# Patient Record
Sex: Male | Born: 1935 | Race: White | Hispanic: No | Marital: Married | Smoking: Former smoker
Health system: Southern US, Community
[De-identification: ages and names within clinical notes are randomized; demographics above are authoritative.]

## PROBLEM LIST (undated history)

## (undated) DIAGNOSIS — Z9889 Other specified postprocedural states: Secondary | ICD-10-CM

## (undated) DIAGNOSIS — I10 Essential (primary) hypertension: Secondary | ICD-10-CM

## (undated) DIAGNOSIS — Z8719 Personal history of other diseases of the digestive system: Secondary | ICD-10-CM

## (undated) DIAGNOSIS — E7800 Pure hypercholesterolemia, unspecified: Secondary | ICD-10-CM

## (undated) DIAGNOSIS — R112 Nausea with vomiting, unspecified: Secondary | ICD-10-CM

## (undated) DIAGNOSIS — C801 Malignant (primary) neoplasm, unspecified: Secondary | ICD-10-CM

## (undated) DIAGNOSIS — M1990 Unspecified osteoarthritis, unspecified site: Secondary | ICD-10-CM

## (undated) DIAGNOSIS — R351 Nocturia: Secondary | ICD-10-CM

---

## 1991-04-07 HISTORY — PX: HERNIA REPAIR: SHX51

## 1998-07-06 HISTORY — PX: APPENDECTOMY: SHX54

## 1999-10-06 HISTORY — PX: CAROTID ENDARTERECTOMY: SUR193

## 2000-05-07 HISTORY — PX: KNEE ARTHROSCOPY: SUR90

## 2002-05-07 HISTORY — PX: CERVICAL FUSION: SHX112

## 2003-06-16 ENCOUNTER — Ambulatory Visit (HOSPITAL_COMMUNITY): Admission: RE | Admit: 2003-06-16 | Discharge: 2003-06-16 | Payer: Self-pay | Admitting: Neurosurgery

## 2004-03-07 HISTORY — PX: BACK SURGERY: SHX140

## 2004-04-05 ENCOUNTER — Inpatient Hospital Stay (HOSPITAL_COMMUNITY): Admission: RE | Admit: 2004-04-05 | Discharge: 2004-04-07 | Payer: Self-pay | Admitting: Neurosurgery

## 2004-07-19 ENCOUNTER — Ambulatory Visit (HOSPITAL_COMMUNITY): Admission: RE | Admit: 2004-07-19 | Discharge: 2004-07-19 | Payer: Self-pay | Admitting: Neurosurgery

## 2005-04-16 ENCOUNTER — Inpatient Hospital Stay (HOSPITAL_COMMUNITY): Admission: RE | Admit: 2005-04-16 | Discharge: 2005-04-17 | Payer: Self-pay | Admitting: Neurosurgery

## 2007-12-06 HISTORY — PX: CHOLECYSTECTOMY: SHX55

## 2010-05-07 DIAGNOSIS — C801 Malignant (primary) neoplasm, unspecified: Secondary | ICD-10-CM

## 2010-05-07 HISTORY — DX: Malignant (primary) neoplasm, unspecified: C80.1

## 2011-01-18 ENCOUNTER — Other Ambulatory Visit (HOSPITAL_COMMUNITY): Payer: Self-pay | Admitting: *Deleted

## 2011-01-18 DIAGNOSIS — C61 Malignant neoplasm of prostate: Secondary | ICD-10-CM

## 2011-01-26 ENCOUNTER — Ambulatory Visit (HOSPITAL_COMMUNITY)
Admission: RE | Admit: 2011-01-26 | Discharge: 2011-01-26 | Disposition: A | Discharge status: Home / Self Care | Payer: Medicare Other | Source: Ambulatory Visit | Attending: Radiology | Admitting: Radiology

## 2011-01-26 DIAGNOSIS — C61 Malignant neoplasm of prostate: Secondary | ICD-10-CM | POA: Insufficient documentation

## 2011-01-26 MED ORDER — GADOBENATE DIMEGLUMINE 529 MG/ML IV SOLN
17.0000 mL | Freq: Once | INTRAVENOUS | Status: AC | PRN
  Administered 2011-01-26: 17 mL via INTRAVENOUS

## 2011-03-09 MED ORDER — GADOBENATE DIMEGLUMINE 529 MG/ML IV SOLN
20.0000 mL | Freq: Once | INTRAVENOUS | Status: AC | PRN
  Administered 2011-03-09: 17 mL via INTRAVENOUS

## 2012-04-06 HISTORY — PX: CARPAL TUNNEL RELEASE: SHX101

## 2013-03-11 ENCOUNTER — Other Ambulatory Visit: Payer: Self-pay | Admitting: Neurosurgery

## 2013-03-25 ENCOUNTER — Encounter (HOSPITAL_COMMUNITY): Payer: Self-pay | Admitting: Pharmacy Technician

## 2013-03-30 NOTE — Pre-Procedure Instructions (Signed)
Raymond Holloway  03/30/2013   Your procedure is scheduled on: Wednesday, April 08, 2013 at 8:30 AM   Report to [facility] Entrance "A" [address] at [employer] AM.  Call this number if you have problems the morning of surgery: [phone]   Remember:   Do not eat food or drink liquids after midnight.   Take these medicines the morning of surgery with A SIP OF WATER: Hydrocodone-acetaminophen (Vicodin), methylphenidate (Ritalin), metoprolol (Lopressor)  STOP taking Aspirin, Goody's, BC's, Aleve (Naproxen), Ibuprofen (Advil or Motrin), Fish Oil, Vitamins, Herbal Supplements or any substance that could thin your blood starting Wednesday 03/31/2013.   Do not wear jewelry.  Do not wear lotions, powders, or colognes. You may wear deodorant.   Men may shave face and neck.  Do not bring valuables to the hospital.  [facility] is not responsible                  for any belongings or valuables.               Contacts, dentures or bridgework may not be worn into surgery.  Leave suitcase in the car. After surgery it may be brought to your room.  For patients admitted to the hospital, discharge time is determined by your                treatment team.               Special Instructions: Shower using CHG 2 nights before surgery and the night before surgery.  If you shower the day of surgery use CHG.  Use special wash - you have one bottle of CHG for all showers.  You should use approximately 1/3 of the bottle for each shower.   Please read over the following fact sheets that you were given: Pain Booklet, Coughing and Deep Breathing, Blood Transfusion Information, MRSA Information and Surgical Site Infection Prevention

## 2013-03-31 ENCOUNTER — Encounter (HOSPITAL_COMMUNITY)
Admission: RE | Admit: 2013-03-31 | Discharge: 2013-03-31 | Disposition: A | Discharge status: Home / Self Care | Payer: Medicare Other | Source: Ambulatory Visit | Attending: Anesthesiology | Admitting: Anesthesiology

## 2013-03-31 ENCOUNTER — Encounter (HOSPITAL_COMMUNITY): Payer: Self-pay

## 2013-03-31 ENCOUNTER — Encounter (HOSPITAL_COMMUNITY)
Admission: RE | Admit: 2013-03-31 | Discharge: 2013-03-31 | Disposition: A | Discharge status: Home / Self Care | Payer: Medicare Other | Source: Ambulatory Visit | Attending: Neurosurgery | Admitting: Neurosurgery

## 2013-03-31 ENCOUNTER — Ambulatory Visit (HOSPITAL_COMMUNITY): Admission: RE | Admit: 2013-03-31 | Payer: Medicare Other | Source: Ambulatory Visit

## 2013-03-31 DIAGNOSIS — Z01812 Encounter for preprocedural laboratory examination: Secondary | ICD-10-CM | POA: Insufficient documentation

## 2013-03-31 DIAGNOSIS — Z01818 Encounter for other preprocedural examination: Secondary | ICD-10-CM | POA: Insufficient documentation

## 2013-03-31 HISTORY — DX: Other specified postprocedural states: Z98.890

## 2013-03-31 HISTORY — DX: Malignant (primary) neoplasm, unspecified: C80.1

## 2013-03-31 HISTORY — DX: Unspecified osteoarthritis, unspecified site: M19.90

## 2013-03-31 HISTORY — DX: Essential (primary) hypertension: I10

## 2013-03-31 HISTORY — DX: Nocturia: R35.1

## 2013-03-31 HISTORY — DX: Nausea with vomiting, unspecified: R11.2

## 2013-03-31 HISTORY — DX: Pure hypercholesterolemia, unspecified: E78.00

## 2013-03-31 HISTORY — DX: Personal history of other diseases of the digestive system: Z87.19

## 2013-03-31 LAB — CBC
HCT: 31.9 % — ABNORMAL LOW (ref 39.0–52.0)
Hemoglobin: 11 g/dL — ABNORMAL LOW (ref 13.0–17.0)
MCH: 32.4 pg (ref 26.0–34.0)
MCHC: 34.5 g/dL (ref 30.0–36.0)
MCV: 93.8 fL (ref 78.0–100.0)
RBC: 3.4 MIL/uL — ABNORMAL LOW (ref 4.22–5.81)

## 2013-03-31 LAB — BASIC METABOLIC PANEL
BUN: 21 mg/dL (ref 6–23)
CO2: 27 mEq/L (ref 19–32)
Chloride: 104 mEq/L (ref 96–112)
GFR calc Af Amer: 74 mL/min — ABNORMAL LOW (ref >90)
GFR calc non Af Amer: 63 mL/min — ABNORMAL LOW (ref >90)
Glucose, Bld: 95 mg/dL (ref 70–99)
Potassium: 4.4 mEq/L (ref 3.5–5.1)
Sodium: 140 mEq/L (ref 135–145)

## 2013-03-31 LAB — TYPE AND SCREEN: Antibody Screen: NEGATIVE

## 2013-03-31 LAB — SURGICAL PCR SCREEN
MRSA, PCR: NEGATIVE
Staphylococcus aureus: NEGATIVE

## 2013-03-31 LAB — ABO/RH: ABO/RH(D): B POS

## 2013-03-31 NOTE — Progress Notes (Addendum)
Pt denies seeing a Cardiologist on a regular basis but reports last EKG was done 04-20-12 at [facility] in [facility], records requested.  Pt denies ever having heart cath, stress test or ECHO done.   Pt denies having CXR done over the last year.  PCP Dr. Lendon Colonel with [facility].  Urologist is Dr. Sabino Gasser in [facility].

## 2013-04-01 ENCOUNTER — Encounter (HOSPITAL_COMMUNITY): Payer: Self-pay

## 2013-04-01 NOTE — Progress Notes (Signed)
Anesthesia chart review: Patient is a 79 year old male scheduled for L3-4, L4-5 PLIF on 04/08/2013 by Dr. Franky Macho.   History includes former smoker, hypertension, hypercholesterolemia, prostate cancer status post chemoradiation, hiatal hernia, arthritis, postoperative nausea and vomiting, cervical fusion '04, cholecystectomy '09, appendectomy '00, back surgery '05, left CTR 04/2012, left carotid endarterectomy '01, hernia repair '92.  PCP is Dr. Lendon Colonel with [facility]. Urologist is Dr. Sabino Gasser in [facility]  He is not followed by cardiology and denies prior cardiac testing.  Pre-operative EKG from 04/21/12 from [facility] showed SB @ 59 bpm, poor r wave progression--consider old anterior infarct, LAD.  He has lost his r wave in lead II (was minimal back in 2006), but otherwise probably not significantly changed since 04/21/12. No CV symptoms documented from his PAT visit.  Preoperative CXR and labs noted.    Reviewed above with anesthesiologist Dr. Noreene Larsson.  Patient will be further evaluated by his assigned anesthesiologist on the day of surgery, but if he remains asymptomatic from a CV standpoint then it is anticipated that he can proceed as planned.  Velna Ochs [facility] Short Stay Center/Anesthesiology Phone [phone] 04/01/2013 11:43 AM

## 2013-04-07 MED ORDER — CEFAZOLIN SODIUM-DEXTROSE 2-3 GM-% IV SOLR
2.0000 g | INTRAVENOUS | Status: AC
  Administered 2013-04-08 (×2): 2 g via INTRAVENOUS
  Filled 2013-04-07: qty 50

## 2013-04-08 ENCOUNTER — Encounter (HOSPITAL_COMMUNITY): Payer: Self-pay | Admitting: Anesthesiology

## 2013-04-08 ENCOUNTER — Encounter (HOSPITAL_COMMUNITY)
Admission: RE | Disposition: A | Discharge status: Home Health | Payer: Medicare Other | Source: Ambulatory Visit | Attending: Neurosurgery

## 2013-04-08 ENCOUNTER — Encounter (HOSPITAL_COMMUNITY): Payer: Medicare Other | Admitting: Vascular Surgery

## 2013-04-08 ENCOUNTER — Inpatient Hospital Stay (HOSPITAL_COMMUNITY)
Admission: RE | Admit: 2013-04-08 | Discharge: 2013-04-12 | DRG: 460 | Disposition: A | Discharge status: Home Health | Payer: Medicare Other | Source: Ambulatory Visit | Attending: Neurosurgery | Admitting: Neurosurgery

## 2013-04-08 ENCOUNTER — Inpatient Hospital Stay (HOSPITAL_COMMUNITY): Payer: Medicare Other | Admitting: Anesthesiology

## 2013-04-08 ENCOUNTER — Inpatient Hospital Stay (HOSPITAL_COMMUNITY): Payer: Medicare Other

## 2013-04-08 DIAGNOSIS — Z87891 Personal history of nicotine dependence: Secondary | ICD-10-CM

## 2013-04-08 DIAGNOSIS — Z8546 Personal history of malignant neoplasm of prostate: Secondary | ICD-10-CM

## 2013-04-08 DIAGNOSIS — M7130 Other bursal cyst, unspecified site: Secondary | ICD-10-CM | POA: Diagnosis present

## 2013-04-08 DIAGNOSIS — E7800 Pure hypercholesterolemia, unspecified: Secondary | ICD-10-CM | POA: Diagnosis present

## 2013-04-08 DIAGNOSIS — M47817 Spondylosis without myelopathy or radiculopathy, lumbosacral region: Principal | ICD-10-CM | POA: Diagnosis present

## 2013-04-08 DIAGNOSIS — Z7982 Long term (current) use of aspirin: Secondary | ICD-10-CM

## 2013-04-08 DIAGNOSIS — M47816 Spondylosis without myelopathy or radiculopathy, lumbar region: Secondary | ICD-10-CM | POA: Diagnosis present

## 2013-04-08 DIAGNOSIS — I10 Essential (primary) hypertension: Secondary | ICD-10-CM | POA: Diagnosis present

## 2013-04-08 DIAGNOSIS — Z79899 Other long term (current) drug therapy: Secondary | ICD-10-CM

## 2013-04-08 SURGERY — POSTERIOR LUMBAR FUSION 2 LEVEL
Anesthesia: General | Site: Spine Lumbar

## 2013-04-08 MED ORDER — OXYCODONE-ACETAMINOPHEN 5-325 MG PO TABS
1.0000 | ORAL_TABLET | ORAL | Status: DC | PRN
  Administered 2013-04-09 – 2013-04-10 (×3): 2 via ORAL
  Administered 2013-04-10: 1 via ORAL
  Administered 2013-04-11 – 2013-04-12 (×4): 2 via ORAL
  Filled 2013-04-08 (×2): qty 2
  Filled 2013-04-08: qty 1
  Filled 2013-04-08 (×5): qty 2

## 2013-04-08 MED ORDER — ACETAMINOPHEN 325 MG PO TABS: 650.0000 mg | ORAL_TABLET | ORAL | Status: DC | PRN

## 2013-04-08 MED ORDER — HYDROMORPHONE HCL PF 1 MG/ML IJ SOLN
INTRAMUSCULAR | Status: DC | PRN
  Administered 2013-04-08: 1 mg via INTRAVENOUS

## 2013-04-08 MED ORDER — SIMVASTATIN 10 MG PO TABS
10.0000 mg | ORAL_TABLET | Freq: Every day | ORAL | Status: DC
  Administered 2013-04-08 – 2013-04-11 (×4): 10 mg via ORAL
  Filled 2013-04-08 (×5): qty 1

## 2013-04-08 MED ORDER — TRAZODONE HCL 50 MG PO TABS
50.0000 mg | ORAL_TABLET | Freq: Every day | ORAL | Status: DC
  Administered 2013-04-08 – 2013-04-11 (×4): 50 mg via ORAL
  Filled 2013-04-08 (×5): qty 1

## 2013-04-08 MED ORDER — SUCCINYLCHOLINE CHLORIDE 20 MG/ML IJ SOLN
INTRAMUSCULAR | Status: DC | PRN
  Administered 2013-04-08: 100 mg via INTRAVENOUS

## 2013-04-08 MED ORDER — MENTHOL 3 MG MT LOZG: 1.0000 | LOZENGE | OROMUCOSAL | Status: DC | PRN

## 2013-04-08 MED ORDER — SODIUM CHLORIDE 0.9 % IV SOLN: 250.0000 mL | INTRAVENOUS | Status: DC

## 2013-04-08 MED ORDER — LIDOCAINE-EPINEPHRINE 0.5 %-1:200000 IJ SOLN
INTRAMUSCULAR | Status: DC | PRN
  Administered 2013-04-08: 10 mL

## 2013-04-08 MED ORDER — FENTANYL CITRATE 0.05 MG/ML IJ SOLN
INTRAMUSCULAR | Status: DC | PRN
  Administered 2013-04-08: 50 ug via INTRAVENOUS
  Administered 2013-04-08 (×2): 100 ug via INTRAVENOUS
  Administered 2013-04-08: 50 ug via INTRAVENOUS
  Administered 2013-04-08 (×2): 100 ug via INTRAVENOUS

## 2013-04-08 MED ORDER — ROCURONIUM BROMIDE 100 MG/10ML IV SOLN
INTRAVENOUS | Status: DC | PRN
  Administered 2013-04-08: 10 mg via INTRAVENOUS
  Administered 2013-04-08: 40 mg via INTRAVENOUS

## 2013-04-08 MED ORDER — ARTIFICIAL TEARS OP OINT
TOPICAL_OINTMENT | OPHTHALMIC | Status: DC | PRN
  Administered 2013-04-08: 1 via OPHTHALMIC

## 2013-04-08 MED ORDER — ONDANSETRON HCL 4 MG/2ML IJ SOLN
INTRAMUSCULAR | Status: DC | PRN
  Administered 2013-04-08: 4 mg via INTRAVENOUS

## 2013-04-08 MED ORDER — ONDANSETRON HCL 4 MG/2ML IJ SOLN
4.0000 mg | INTRAMUSCULAR | Status: DC | PRN
  Administered 2013-04-08 – 2013-04-09 (×4): 4 mg via INTRAVENOUS
  Filled 2013-04-08 (×4): qty 2

## 2013-04-08 MED ORDER — CALCIUM CARBONATE 1250 (500 CA) MG PO TABS
1.0000 | ORAL_TABLET | Freq: Every day | ORAL | Status: DC
  Administered 2013-04-09 – 2013-04-12 (×4): 500 mg via ORAL
  Filled 2013-04-08 (×5): qty 1

## 2013-04-08 MED ORDER — CALCIUM 1250 MG PO TABS: 1.0000 | ORAL_TABLET | Freq: Every morning | ORAL | Status: DC

## 2013-04-08 MED ORDER — CEFAZOLIN SODIUM-DEXTROSE 2-3 GM-% IV SOLR
INTRAVENOUS | Status: AC
  Filled 2013-04-08: qty 50

## 2013-04-08 MED ORDER — POTASSIUM CHLORIDE IN NACL 20-0.9 MEQ/L-% IV SOLN
INTRAVENOUS | Status: DC
  Administered 2013-04-08 – 2013-04-09 (×2): via INTRAVENOUS
  Administered 2013-04-10: 1000 mL via INTRAVENOUS
  Administered 2013-04-10: 08:00:00 via INTRAVENOUS
  Administered 2013-04-11: 1000 mL via INTRAVENOUS
  Filled 2013-04-08 (×9): qty 1000

## 2013-04-08 MED ORDER — KETOROLAC TROMETHAMINE 30 MG/ML IJ SOLN
30.0000 mg | Freq: Four times a day (QID) | INTRAMUSCULAR | Status: DC
  Administered 2013-04-08 – 2013-04-11 (×13): 30 mg via INTRAVENOUS
  Filled 2013-04-08 (×20): qty 1

## 2013-04-08 MED ORDER — PHENOL 1.4 % MT LIQD: 1.0000 | OROMUCOSAL | Status: DC | PRN

## 2013-04-08 MED ORDER — PROPOFOL 10 MG/ML IV BOLUS
INTRAVENOUS | Status: DC | PRN
  Administered 2013-04-08: 100 mg via INTRAVENOUS

## 2013-04-08 MED ORDER — METHYLPHENIDATE HCL 10 MG PO TABS: 10.0000 mg | ORAL_TABLET | Freq: Every day | ORAL | Status: DC

## 2013-04-08 MED ORDER — LIDOCAINE HCL (CARDIAC) 20 MG/ML IV SOLN
INTRAVENOUS | Status: DC | PRN
  Administered 2013-04-08: 80 mg via INTRAVENOUS

## 2013-04-08 MED ORDER — 0.9 % SODIUM CHLORIDE (POUR BTL) OPTIME
TOPICAL | Status: DC | PRN
  Administered 2013-04-08: 1000 mL

## 2013-04-08 MED ORDER — CEFAZOLIN SODIUM 1-5 GM-% IV SOLN
1.0000 g | Freq: Three times a day (TID) | INTRAVENOUS | Status: AC
  Administered 2013-04-08 – 2013-04-09 (×2): 1 g via INTRAVENOUS
  Filled 2013-04-08 (×2): qty 50

## 2013-04-08 MED ORDER — LACTATED RINGERS IV SOLN
INTRAVENOUS | Status: DC | PRN
  Administered 2013-04-08 (×3): via INTRAVENOUS

## 2013-04-08 MED ORDER — ZOLPIDEM TARTRATE 5 MG PO TABS: 5.0000 mg | ORAL_TABLET | Freq: Every evening | ORAL | Status: DC | PRN

## 2013-04-08 MED ORDER — HYDROMORPHONE HCL PF 1 MG/ML IJ SOLN: 0.2500 mg | INTRAMUSCULAR | Status: DC | PRN

## 2013-04-08 MED ORDER — ACETAMINOPHEN 650 MG RE SUPP: 650.0000 mg | RECTAL | Status: DC | PRN

## 2013-04-08 MED ORDER — POLYETHYLENE GLYCOL 3350 17 G PO PACK
17.0000 g | PACK | Freq: Every day | ORAL | Status: DC | PRN
  Filled 2013-04-08: qty 1

## 2013-04-08 MED ORDER — SODIUM CHLORIDE 0.9 % IJ SOLN: 3.0000 mL | INTRAMUSCULAR | Status: DC | PRN

## 2013-04-08 MED ORDER — GLYCOPYRROLATE 0.2 MG/ML IJ SOLN
INTRAMUSCULAR | Status: DC | PRN
  Administered 2013-04-08: 0.4 mg via INTRAVENOUS

## 2013-04-08 MED ORDER — SENNA 8.6 MG PO TABS
1.0000 | ORAL_TABLET | Freq: Two times a day (BID) | ORAL | Status: DC
  Administered 2013-04-08 – 2013-04-12 (×8): 8.6 mg via ORAL
  Filled 2013-04-08 (×9): qty 1

## 2013-04-08 MED ORDER — ALBUMIN HUMAN 5 % IV SOLN
INTRAVENOUS | Status: DC | PRN
  Administered 2013-04-08 (×2): via INTRAVENOUS

## 2013-04-08 MED ORDER — SODIUM CHLORIDE 0.9 % IJ SOLN
3.0000 mL | Freq: Two times a day (BID) | INTRAMUSCULAR | Status: DC
  Administered 2013-04-12: 3 mL via INTRAVENOUS

## 2013-04-08 MED ORDER — ASPIRIN EC 81 MG PO TBEC
81.0000 mg | DELAYED_RELEASE_TABLET | Freq: Every day | ORAL | Status: DC
  Administered 2013-04-08 – 2013-04-12 (×5): 81 mg via ORAL
  Filled 2013-04-08 (×5): qty 1

## 2013-04-08 MED ORDER — THROMBIN 20000 UNITS EX SOLR
CUTANEOUS | Status: DC | PRN
  Administered 2013-04-08: 10:00:00 via TOPICAL

## 2013-04-08 MED ORDER — HYDROMORPHONE HCL PF 1 MG/ML IJ SOLN
0.5000 mg | INTRAMUSCULAR | Status: DC | PRN
  Administered 2013-04-08 – 2013-04-09 (×3): 1 mg via INTRAVENOUS
  Filled 2013-04-08 (×3): qty 1

## 2013-04-08 MED ORDER — NEOSTIGMINE METHYLSULFATE 1 MG/ML IJ SOLN
INTRAMUSCULAR | Status: DC | PRN
  Administered 2013-04-08: 3 mg via INTRAVENOUS

## 2013-04-08 MED ORDER — ONDANSETRON HCL 4 MG/2ML IJ SOLN: 4.0000 mg | Freq: Once | INTRAMUSCULAR | Status: DC | PRN

## 2013-04-08 MED ORDER — KETOROLAC TROMETHAMINE 30 MG/ML IJ SOLN
INTRAMUSCULAR | Status: AC
  Administered 2013-04-08: 30 mg
  Filled 2013-04-08: qty 1

## 2013-04-08 MED ORDER — ACETAMINOPHEN 10 MG/ML IV SOLN
INTRAVENOUS | Status: AC
  Administered 2013-04-08: 1000 mg via INTRAVENOUS
  Filled 2013-04-08: qty 100

## 2013-04-08 MED ORDER — DEXAMETHASONE SODIUM PHOSPHATE 10 MG/ML IJ SOLN
INTRAMUSCULAR | Status: AC
  Administered 2013-04-08: 10 mg via INTRAVENOUS
  Filled 2013-04-08: qty 1

## 2013-04-08 MED ORDER — DIAZEPAM 5 MG PO TABS
5.0000 mg | ORAL_TABLET | Freq: Four times a day (QID) | ORAL | Status: DC | PRN
  Administered 2013-04-08 – 2013-04-11 (×5): 5 mg via ORAL
  Filled 2013-04-08 (×5): qty 1

## 2013-04-08 MED ORDER — PHENYLEPHRINE HCL 10 MG/ML IJ SOLN
10.0000 mg | INTRAVENOUS | Status: DC | PRN
  Administered 2013-04-08: 25 ug/min via INTRAVENOUS

## 2013-04-08 MED ORDER — METOPROLOL TARTRATE 25 MG PO TABS
25.0000 mg | ORAL_TABLET | Freq: Two times a day (BID) | ORAL | Status: DC
  Administered 2013-04-08 – 2013-04-12 (×8): 25 mg via ORAL
  Filled 2013-04-08 (×9): qty 1

## 2013-04-08 SURGICAL SUPPLY — 76 items
BAG DECANTER FOR FLEXI CONT (MISCELLANEOUS) ×2 IMPLANT
BENZOIN TINCTURE PRP APPL 2/3 (GAUZE/BANDAGES/DRESSINGS) IMPLANT
BLADE SURG ROTATE 9660 (MISCELLANEOUS) IMPLANT
BUR MATCHSTICK NEURO 3.0 LAGG (BURR) ×2 IMPLANT
CAGE COROENT 13X9X23-4 (Cage) ×4 IMPLANT
CAGE COROENT MP 12X9X23-4 (Cage) ×4 IMPLANT
CANISTER SUCT 3000ML (MISCELLANEOUS) ×2 IMPLANT
CATH COUDE FOLEY 2W 5CC 16FR (CATHETERS) ×2 IMPLANT
CONT SPEC 4OZ CLIKSEAL STRL BL (MISCELLANEOUS) ×2 IMPLANT
COVER BACK TABLE 24X17X13 BIG (DRAPES) IMPLANT
DECANTER SPIKE VIAL GLASS SM (MISCELLANEOUS) ×2 IMPLANT
DERMABOND ADVANCED (GAUZE/BANDAGES/DRESSINGS)
DERMABOND ADVANCED .7 DNX12 (GAUZE/BANDAGES/DRESSINGS) IMPLANT
DRAPE C-ARM 42X72 X-RAY (DRAPES) ×4 IMPLANT
DRAPE C-ARMOR (DRAPES) ×2 IMPLANT
DRAPE LAPAROTOMY 100X72X124 (DRAPES) ×2 IMPLANT
DRAPE MICROSCOPE LEICA (MISCELLANEOUS) ×2 IMPLANT
DRAPE POUCH INSTRU U-SHP 10X18 (DRAPES) ×2 IMPLANT
DRAPE SURG 17X23 STRL (DRAPES) ×2 IMPLANT
DRESSING TELFA 8X3 (GAUZE/BANDAGES/DRESSINGS) IMPLANT
DRSG OPSITE POSTOP 4X8 (GAUZE/BANDAGES/DRESSINGS) ×2 IMPLANT
DURAPREP 26ML APPLICATOR (WOUND CARE) ×2 IMPLANT
ELECT REM PT RETURN 9FT ADLT (ELECTROSURGICAL) ×2
ELECTRODE REM PT RTRN 9FT ADLT (ELECTROSURGICAL) ×1 IMPLANT
GAUZE SPONGE 4X4 16PLY XRAY LF (GAUZE/BANDAGES/DRESSINGS) ×2 IMPLANT
GLOVE BIOGEL PI IND STRL 7.0 (GLOVE) ×4 IMPLANT
GLOVE BIOGEL PI IND STRL 7.5 (GLOVE) ×1 IMPLANT
GLOVE BIOGEL PI IND STRL 8.5 (GLOVE) ×1 IMPLANT
GLOVE BIOGEL PI INDICATOR 7.0 (GLOVE) ×4
GLOVE BIOGEL PI INDICATOR 7.5 (GLOVE) ×1
GLOVE BIOGEL PI INDICATOR 8.5 (GLOVE) ×1
GLOVE ECLIPSE 6.5 STRL STRAW (GLOVE) ×4 IMPLANT
GLOVE ECLIPSE 7.5 STRL STRAW (GLOVE) ×2 IMPLANT
GLOVE ECLIPSE 8.5 STRL (GLOVE) ×2 IMPLANT
GLOVE EXAM NITRILE LRG STRL (GLOVE) IMPLANT
GLOVE EXAM NITRILE MD LF STRL (GLOVE) IMPLANT
GLOVE EXAM NITRILE XL STR (GLOVE) IMPLANT
GLOVE EXAM NITRILE XS STR PU (GLOVE) IMPLANT
GLOVE SURG SS PI 7.0 STRL IVOR (GLOVE) ×10 IMPLANT
GOWN BRE IMP SLV AUR LG STRL (GOWN DISPOSABLE) ×4 IMPLANT
GOWN BRE IMP SLV AUR XL STRL (GOWN DISPOSABLE) ×4 IMPLANT
GOWN STRL REIN 2XL LVL4 (GOWN DISPOSABLE) ×2 IMPLANT
KIT BASIN OR (CUSTOM PROCEDURE TRAY) ×2 IMPLANT
KIT POSITION SURG JACKSON T1 (MISCELLANEOUS) ×2 IMPLANT
KIT ROOM TURNOVER OR (KITS) ×2 IMPLANT
MILL MEDIUM DISP (BLADE) ×2 IMPLANT
NEEDLE HYPO 25X1 1.5 SAFETY (NEEDLE) ×2 IMPLANT
NEEDLE SPNL 18GX3.5 QUINCKE PK (NEEDLE) ×2 IMPLANT
NS IRRIG 1000ML POUR BTL (IV SOLUTION) ×2 IMPLANT
PACK LAMINECTOMY NEURO (CUSTOM PROCEDURE TRAY) ×2 IMPLANT
PAD ARMBOARD 7.5X6 YLW CONV (MISCELLANEOUS) ×6 IMPLANT
PATTIES SURGICAL .5 X.5 (GAUZE/BANDAGES/DRESSINGS) ×2 IMPLANT
ROD 70MM (Rod) ×2 IMPLANT
ROD SPNL 70XPREBNT NS MAS (Rod) ×2 IMPLANT
RUBBERBAND STERILE (MISCELLANEOUS) ×4 IMPLANT
SCREW LOCK (Screw) ×6 IMPLANT
SCREW LOCK FXNS SPNE MAS PL (Screw) ×6 IMPLANT
SCREW SHANK 5.0X30MM (Screw) ×4 IMPLANT
SCREW SHANK 5.5X25 (Screw) ×2 IMPLANT
SCREW SHANK 5.5X30MM (Screw) ×6 IMPLANT
SCREW TULIP 5.5 (Screw) ×12 IMPLANT
SPONGE GAUZE 4X4 12PLY (GAUZE/BANDAGES/DRESSINGS) IMPLANT
SPONGE LAP 4X18 X RAY DECT (DISPOSABLE) ×2 IMPLANT
SPONGE SURGIFOAM ABS GEL 100 (HEMOSTASIS) ×2 IMPLANT
STRIP CLOSURE SKIN 1/2X4 (GAUZE/BANDAGES/DRESSINGS) IMPLANT
SUT ETHILON 3 0 FSL (SUTURE) ×2 IMPLANT
SUT PROLENE 6 0 BV (SUTURE) ×6 IMPLANT
SUT VIC AB 0 CT1 18XCR BRD8 (SUTURE) ×3 IMPLANT
SUT VIC AB 0 CT1 8-18 (SUTURE) ×3
SUT VIC AB 2-0 CT1 18 (SUTURE) ×4 IMPLANT
SUT VIC AB 3-0 SH 8-18 (SUTURE) ×8 IMPLANT
SYR 20ML ECCENTRIC (SYRINGE) ×2 IMPLANT
SYR 3ML LL SCALE MARK (SYRINGE) ×4 IMPLANT
TOWEL OR 17X24 6PK STRL BLUE (TOWEL DISPOSABLE) ×2 IMPLANT
TOWEL OR 17X26 10 PK STRL BLUE (TOWEL DISPOSABLE) ×2 IMPLANT
WATER STERILE IRR 1000ML POUR (IV SOLUTION) ×2 IMPLANT

## 2013-04-08 NOTE — Transfer of Care (Signed)
Immediate Anesthesia Transfer of Care Note  Patient: Raymond Holloway  Procedure(s) Performed: Procedure(s): LUMBAR THREE-FOUR,LUMBAR FOUR-FIVE POSTERIOR LUMBAR INTERBODY FUSION WITH INTERBODY PROSTHESIS POSTERIOR LATERAL ARTHRODESIS AND POSTERIOR SEGMENTAL INSTRUMENTATION (N/A)  Patient Location: PACU  Anesthesia Type:General  Level of Consciousness: sedated, patient cooperative and responds to stimulation  Airway & Oxygen Therapy: Patient Spontanous Breathing and Patient connected to nasal cannula oxygen  Post-op Assessment: Report given to PACU RN, Post -op Vital signs reviewed and stable and Patient moving all extremities X 4  Post vital signs: Reviewed and stable  Complications: No apparent anesthesia complications

## 2013-04-08 NOTE — Anesthesia Preprocedure Evaluation (Signed)
Anesthesia Evaluation  Patient identified by MRN, date of birth, ID band Patient awake    Reviewed: Allergy & Precautions, H&P , NPO status , Patient's Chart, lab work & pertinent test results  History of Anesthesia Complications (+) PONV and history of anesthetic complications  Airway       Dental   Pulmonary former smoker,          Cardiovascular hypertension,     Neuro/Psych    GI/Hepatic hiatal hernia,   Endo/Other    Renal/GU      Musculoskeletal   Abdominal   Peds  Hematology   Anesthesia Other Findings   Reproductive/Obstetrics                           Anesthesia Physical Anesthesia Plan  ASA: II  Anesthesia Plan: General   Post-op Pain Management:    Induction: Intravenous  Airway Management Planned: Oral ETT  Additional Equipment:   Intra-op Plan:   Post-operative Plan: Extubation in OR  Informed Consent: I have reviewed the patients History and Physical, chart, labs and discussed the procedure including the risks, benefits and alternatives for the proposed anesthesia with the patient or authorized representative who has indicated his/her understanding and acceptance.     Plan Discussed with:   Anesthesia Plan Comments:         Anesthesia Quick Evaluation

## 2013-04-08 NOTE — H&P (Signed)
BP 126/59  Pulse 59  Temp(Src) 97.2 F (36.2 C) (Oral)  Resp 18   Raymond Holloway comes in today for evaluation of pain which he has in his lower back, exacerbated with walking and/or standing. Raymond Holloway is a patient of mine whom I had taken to the operating room most recently in 2006 for a C5-6 anterior cervical decompression and arthrodesis. He had done well since that time with regards to his neck. He has had low back problems now for quite some time, and he has been receiving conservative treatment by Raymond Holloway. He has received epidural injections and the like. He simply says at this point, over the last 77 months, it has just become much worse with regards to the pain that he has in his lower back. The last scan he had was in October of 2013, and that was an MRI of the lumbar spine, and what that showed was a very large synovial cyst on the right at L4-5 and degenerative changes at the joints at L3-4, L4-5, and at L5-S1. He has had no bowel or bladder dysfunction.   He says that he has had this pain in his lower back for at least 79 years. He feels weakness in his back and right and left buttocks and left lower extremity, but the pain is worse in his right lower extremity.   PAST MEDICAL HISTORY:                   Current medical conditions:  Significant for prostate cancer and hypertension.            Past surgical history:  I did the cervical surgery in 2006. He has undergone an appendectomy. He has had a carpal tunnel release. He has had a cholecystectomy, and he has had low back surgery.            Medications and allergies:  Current medications are alendronate, aspirin, calcium, hydrocodone, methylphenidate, metoprolol, Prevacid, trazodone, and zolpidem.   FAMILY HISTORY:                                His mother and father are both deceased. Diabetes, cancer, and hypertension are present in the family history.   SOCIAL HISTORY:                                He is right  handed, 79 years of age and currently retired. He still is active playing golf, but he says he pays for it the next day no matter what, and he has to absolutely use a golf cart.   REVIEW OF SYSTEMS:                                    Review of systems is positive for eyeglasses, hearing aid, hypertension, hypercholesterolemia, leg pain with walking, prostate cancer, leg weakness, back pain, leg pain, arthritis, and knee pain. He denies allergic, hematologic, endocrine, psychiatric, neurological, skin, gastrointestinal, constitutional, or respiratory problems. On his pain chart, he lists pain on both right and left lower extremities, right greater than left.   PHYSICAL EXAMINATION:                   General - He is self-reported at 5\' 11" , though I could tell you  he is actually approximately 5\' 8" . He says his weight is 189 pounds. I can tell you he is not 5\' 11"  because he is not as tall as I am, and I am not 5\' 11" . He is alert and oriented x4, and answering all questions appropriately. Memory, language, attention span, and fund of knowledge are normal. He is well-kempt and in no distress.            Motor Examination - He has 5/5 strength in his upper and lower extremities. Normal muscle tone normal muscle tone, bulk, and coordination.            Deep tendon reflexes - He has 2+ reflexes at the knees, trace in the right ankle and 1+ in the left ankle.            Cranial Nerve Examination - Pupils are equal, round, and reactive to light. Full extraocular movements. Hearing is intact to finger rub bilaterally. This is with his hearing aids. Tongue and uvula are in the midline. Shoulder shrug is normal.            Cerebellar Examination - He has normal balance.        Raymond Holloway returns today with an MRI of the lumbar spine.  It shows a significant facet arthropathy at L3-4 and L4-5.  He has a synovial cyst on the left side.  He is stenotic to those regions.  Raymond Holloway complains about significant  pain whenever he has to stand or getup from a chair.  He states his legs hurt whenever he stands and hurt more as long as he stands.  He has classic signs of neurogenic claudication.  He is stenotic on his MRI scan.   PHYSICAL EXAMINATION:                    Exam today is unchanged, alert and oriented x4 and answering all questions appropriately.  5/5 strength in the lower extremities.  Normal muscle tone, bulk, and coordination.  Speech is clear and fluent.   IMPRESSION/PLAN:                             I believe that he would be benefited from a lumbar decompression L3-5, posterior lumbar arthrodesis and pedicle screw fixation.  I think his legs will feel better fairly quickly.  I think his back will take time secondary to surgery.  He received a sheet about lumbar fusions at his last visit.  He needs some more time to think about this.

## 2013-04-08 NOTE — Anesthesia Postprocedure Evaluation (Signed)
  Anesthesia Post-op Note  Patient: Raymond Holloway  Procedure(s) Performed: Procedure(s): LUMBAR THREE-FOUR,LUMBAR FOUR-FIVE POSTERIOR LUMBAR INTERBODY FUSION WITH INTERBODY PROSTHESIS POSTERIOR LATERAL ARTHRODESIS AND POSTERIOR SEGMENTAL INSTRUMENTATION (N/A)  Patient Location: PACU  Anesthesia Type:General  Level of Consciousness: awake, alert , oriented and patient cooperative  Airway and Oxygen Therapy: Patient Spontanous Breathing  Post-op Pain: mild  Post-op Assessment: Post-op Vital signs reviewed, Patient's Cardiovascular Status Stable, Respiratory Function Stable, Patent Airway, No signs of Nausea or vomiting and Pain level controlled  Post-op Vital Signs: stable  Complications: No apparent anesthesia complications

## 2013-04-08 NOTE — Preoperative (Signed)
Beta Blockers   Reason not to administer Beta Blockers:Not Applicable 

## 2013-04-08 NOTE — Op Note (Signed)
04/08/2013  3:38 PM  PATIENT:  Raymond Holloway  79 y.o. male with long standing lumbar stenosis and neurogenic claudication. He has failed conservative treatment and would like to pursue operative decompression  PRE-OPERATIVE DIAGNOSIS:  lumbar stenosis  lumbar synovial cyst  lumbar spondylosis L3/4,4/5  POST-OPERATIVE DIAGNOSIS:  lumbar stenosis,lumbar synovial cyst, lumbar spondylosis L3/4,4/5  PROCEDURE:  Procedure(s): LUMBAR THREE-FOUR,LUMBAR FOUR-FIVE POSTERIOR LUMBAR INTERBODY FUSION WITH INTERBODY PROSTHESes peek cages with morselized autograft Lumbar decompression L3,L4, and L5 nerve roots bilaterally, well beyond what was needed for a PLIF at the L3/4 and L4/5 levels  POSTERIOR LATERAL ARTHRODESIS L3-L5 morselized local autograft L3/4,4/5 POSTERIOR SEGMENTAL INSTRUMENTATION Nuvasive Helix screws, medial to lateral screw placement  SURGEON:  Surgeon(s): Carmela Hurt, MD Barnett Abu, MD  ASSISTANTS:Elsner, Sherilyn Cooter  ANESTHESIA:   general  EBL:  Total I/O In: 3710 [I.V.:2950; Blood:260; IV Piggyback:500] Out: 1400 [Urine:550; Blood:850]  BLOOD ADMINISTERED:260 CC CELLSAVER  CELL SAVER GIVEN:yes  COUNT:one needle missing  DRAINS: none   SPECIMEN:  No Specimen  DICTATION: Mr. Coran was brought to the operating room, intubated, and placed under general anesthetic without difficulty. He was positioned prone on the [place] table with all pressure points padded. A foley catheter was placed under sterile conditions. Once positioned his back was prepped and draped in a sterile manner. I infiltrated 10 cc lidocaine into the lumbar region then opened the skin with a 10 blade. I exposed the lamina of L2-L5 bilaterally then placed self retaining retractors. I confirmed my location with intraoperative xray at the L3/4, and 4/5 levels. I then started my decompression.  I decompressed the L3,4.and L5 roots by performing facetectomies of the inferior facets of L3, and L4. The neural  foramina were unroofed at 3/4, and 4/5, and going out into the foramina of L5/S1. This was well beyond what was necessary in order to expose and perform discetomies at the L3/4, and 4/5 disc spaces.  I performed PLif's at the 3/4, and 4/5 levels with the assistance of Dr. Barnett Abu. We did discetomies at each level bilaterally and removed most of the soft tissue to prepare the disc spaces for the Peek cages. I measured the spaces and placed 12x65mm cages in L3/4, and 13x56mm cages at L4/5. Additional graft was placed in the disc spaces prior to placing the cages. With the interbody arthrodesis complete I placed the pedicle screws. Using the fluoroscopy I placed 6 pedicle screws medial to lateral through the cortical bone. No medial cutouts were appreciated. All screws were placed in the following fashion, I drilled a pilot hole, then drilled through the pedicle in the planned screw trajectory. I assessed the integrity of each hole, then tapped each hole. I then once more checked the screw holes, and finally placed the screws without difficulty.  I decorticated the lateral gutters, and placed more local autograft laterally to complete the posterolateral arthrodesis.  I placed the screw heads on, then connected the rods and locked them into position with the locking caps. Final construct looked good. I also primarily closed two durotomies. There was a fairly large synovial cyst on the right side at L4/5 which was quite adherent to the theca.  I closed the wound in layered fashion with vicryl sutures. I approximated the skin with a nylon suture. I applied a sterile dressing.   PLAN OF CARE: Admit to inpatient   PATIENT DISPOSITION:  PACU - hemodynamically stable.   Delay start of Pharmacological VTE agent (>24hrs) due to surgical blood loss  or risk of bleeding:  yes

## 2013-04-09 MED ORDER — SENNOSIDES-DOCUSATE SODIUM 8.6-50 MG PO TABS: 1.0000 | ORAL_TABLET | Freq: Every evening | ORAL | Status: DC | PRN

## 2013-04-09 MED ORDER — DIAZEPAM 5 MG/ML IJ SOLN
5.0000 mg | Freq: Once | INTRAMUSCULAR | Status: AC
  Administered 2013-04-09: 5 mg via INTRAVENOUS
  Filled 2013-04-09: qty 2

## 2013-04-09 MED ORDER — LIDOCAINE HCL 2 % EX GEL
Freq: Once | CUTANEOUS | Status: AC
  Administered 2013-04-09: 18:00:00 via URETHRAL
  Filled 2013-04-09: qty 5

## 2013-04-09 MED ORDER — ALUM & MAG HYDROXIDE-SIMETH 200-200-20 MG/5ML PO SUSP: 15.0000 mL | ORAL | Status: DC | PRN

## 2013-04-09 NOTE — Progress Notes (Signed)
OT / PT Cancellation Note  Patient Details Name: Raymond Holloway MRN: [id number] DOB: 01/24/1934   Cancelled Treatment:    Reason Eval/Treat Not Completed: Patient not medically ready (Bedrest per RN and pt's wife) Pt currently with OT/ PT orders. RN and wife reporting patient on bedrest for 72 hours. MD please clarify in order set if patient is on strict bedrest or incr activity as tolerated if therapy is appropriate at this time. OT / PT to hold evaluation at this time.  Harolyn Rutherford Pager: [phone]  04/09/2013, 8:31 AM

## 2013-04-09 NOTE — Progress Notes (Signed)
Patient ID: Raymond Holloway, male   DOB: 08/08/1933, 80 y.o.   MRN: [id number] BP 135/48  Pulse 78  Temp(Src) 98.8 F (37.1 C) (Oral)  Resp 20  Ht 5\' 11"  (1.803 m)  Wt 86.183 kg (190 lb)  BMI 26.51 kg/m2  SpO2 97% Alert and oriented x 4. Speech is clear and fluent Moving lower extremities well Wound is clean And dry.  Reinserted foley catheter, removed mistakenly this am.  Overall doing well.

## 2013-04-09 NOTE — Progress Notes (Signed)
Utilization review completed.  

## 2013-04-09 NOTE — Progress Notes (Signed)
Pt's foley was d/c this morning at 0630 per hospital protocol post-op day 1 after surgery, MD did not indicate to leave foley in place but has an order under manage orders dated 04/08/13 @1632  to do in and out cath if pt is unable to void after 6hrs and prn. Will continue to monitor pr.----Antavious Spanos, rn

## 2013-04-09 NOTE — Progress Notes (Signed)
Pt foley was mistakenly dc/d this am by night RN but was informed to monitor pt to void. Pt didn't void after six hours and retaining UA; MD notified and received order to insert coude foley. MD came to insert the foley and UA returned in bag. Incoming RN informed of the foley and informed not to dc foley. Will continue to monitor pt.

## 2013-04-09 NOTE — Progress Notes (Signed)
Ayshia Gramlich, PT DPT  [phone]  

## 2013-04-09 NOTE — Progress Notes (Signed)
   CARE MANAGEMENT NOTE 04/09/2013  Patient:  Raymond Holloway, Raymond Holloway   Account Number:  [id number]  Date Initiated:  04/09/2013  Documentation initiated by:  Jiles Crocker  Subjective/Objective Assessment:   ADMITTED FOR BACK SURGERY     Action/Plan:   CM FOLLOWING FOR DCP   Anticipated DC Date:  04/14/2013   Anticipated DC Plan:  POSSIBLY HOME W HOME HEALTH SERVICES, AWAITING FOR PT/OT EVALS FOR DC NEEDS     DC Planning Services  CM consult          Status of service:  In process, will continue to follow Medicare Important Message given?  NA - LOS <3 / Initial given by admissions (If response is "NO", the following Medicare IM given date fields will be blank)  Per UR Regulation:  Reviewed for med. necessity/level of care/duration of stay  Comments:  12/4/2014Abelino Derrick RN,BSN,MHA [phone]

## 2013-04-09 NOTE — Clinical Social Work Note (Signed)
CSW received consult for possible SNF placement once pt is medically stable for discharge. At this time, PT/OT unable to work with pt due to possible bedrest restrictions. CSW signing off. Please re consult once pt able to participate in PT/OT.  Darlyn Chamber, LCSWA Clinical Social Worker [phone]

## 2013-04-10 MED FILL — Heparin Sodium (Porcine) Inj 1000 Unit/ML: INTRAMUSCULAR | Qty: 30 | Status: AC

## 2013-04-10 MED FILL — Sodium Chloride IV Soln 0.9%: INTRAVENOUS | Qty: 1000 | Status: AC

## 2013-04-10 NOTE — Progress Notes (Signed)
Patient ID: Raymond Holloway, male   DOB: [birth year], 79 y.o.   MRN: [id number] BP 157/64  Pulse 73  Temp(Src) 98.9 F (37.2 C) (Oral)  Resp 20  Ht 5\' 11"  (1.803 m)  Wt 86.183 kg (190 lb)  BMI 26.51 kg/m2  SpO2 83% Alert and oriented x 4 Moving all extremities well Wound dressing clean, flat, dry. No signs of infection Up tomorrow.

## 2013-04-10 NOTE — Clinical Social Work Note (Signed)
CSW received consult for possible SNF placement once pt is medically stable for discharge. At this time, PT/OT unable to work with pt due to possible bedrest restrictions. CSW signing off. Please re consult once pt able to participate in PT/OT. ° °Emily [place], LCSWA °Clinical Social Worker °[phone] °

## 2013-04-11 MED ORDER — CYCLOBENZAPRINE HCL 10 MG PO TABS: 10.0000 mg | ORAL_TABLET | Freq: Three times a day (TID) | ORAL | Status: AC | PRN

## 2013-04-11 MED ORDER — OXYCODONE-ACETAMINOPHEN 5-325 MG PO TABS: 1.0000 | ORAL_TABLET | Freq: Four times a day (QID) | ORAL | Status: AC | PRN

## 2013-04-11 NOTE — Progress Notes (Signed)
Patient IV infiltrated in right hand.  Patient complaining of pain 8/10.  Removed IV and assessed site.  Site swollen and tender to touch.  Pain medicine given and heat packs applied and arm elevated.  Will continue to monitor.  Mariam Dollar

## 2013-04-11 NOTE — Progress Notes (Signed)
Patient is refusing IV at this time.  He understands that he can no longer receive his IV toradol.  He will let the RN know if he changes his mind.  Lance Bosch, RN

## 2013-04-11 NOTE — Progress Notes (Signed)
Patient sat up on the side of the bed with little trouble and is eating his lunch.  Will continue to monitor.  Lance Bosch, RN

## 2013-04-11 NOTE — Progress Notes (Signed)
Coude catheter removed.  Patient tolerated well.  Will continue to monitor and watch for void before 1800.  Mariam Dollar

## 2013-04-11 NOTE — Progress Notes (Signed)
Patient ID: Raymond Holloway, male   DOB: 15-Jun-1933, 79 y.o.   MRN: [id number] BP 155/57  Pulse 73  Temp(Src) 97.7 F (36.5 C) (Oral)  Resp 20  Ht 5\' 11"  (1.803 m)  Wt 86.183 kg (190 lb)  BMI 26.51 kg/m2  SpO2 97% Alert and oriented x 4 Moving well Wound is clean, dry, no signs of infection Will get up today, discontinue foley. Pt and OT consults

## 2013-04-11 NOTE — Progress Notes (Signed)
Patient ambulated to the restroom using a rolling walker and 1 assist.  Patient tolerated well.  Mariam Dollar

## 2013-04-11 NOTE — Evaluation (Signed)
Occupational Therapy Evaluation Patient Details Name: Raymond Holloway MRN: [id number] DOB: 03/30/1934 Today's Date: 04/11/2013 Time: 7829-5621 OT Time Calculation (min): 27 min  OT Assessment / Plan / Recommendation History of present illness 79 y.o. s/p LUMBAR THREE-FOUR,LUMBAR FOUR-FIVE POSTERIOR LUMBAR INTERBODY FUSION WITH INTERBODY PROSTHESIS POSTERIOR LATERAL ARTHRODESIS AND POSTERIOR SEGMENTAL INSTRUMENTATION (N/A)   Clinical Impression   Pt presents with below problem list. Pt independent with ADLs, PTA. Pt will benefit from acute OT to increase independence prior to d/c.     OT Assessment  Patient needs continued OT Services    Follow Up Recommendations  No OT follow up;Supervision/Assistance - 24 hour    Barriers to Discharge      Equipment Recommendations  3 in 1 bedside comode;Other (comment) (AE-long handled sponge, long shoehorn, sockaid, reacher)    Recommendations for Other Services    Frequency  Min 2X/week    Precautions / Restrictions Precautions Precautions: Back;Fall Precaution Booklet Issued:  (had one in room) Precaution Comments: Reviewed precautions with pt and wife   Pertinent Vitals/Pain Pain 1/10. Repositioned.     ADL  Grooming: Set up;Supervision/safety Where Assessed - Grooming: Supported sitting Upper Body Dressing: Set up;Supervision/safety Where Assessed - Upper Body Dressing: Supported sitting Lower Body Dressing: Minimal assistance Where Assessed - Lower Body Dressing: Supported sit to stand Toilet Transfer: Hydrographic surveyor Method: Sit to Barista: Regular height toilet;Grab bars;Bedside commode Tub/Shower Transfer Method: Not assessed Equipment Used: Gait belt;Rolling walker;Reacher;Long-handled sponge;Long-handled shoe horn;Sock aid Transfers/Ambulation Related to ADLs: Min guard for ambulation. Min guard for transfers. ADL Comments: Pt practiced with reacher and sockaid for donning doffing socks. Pt  practiced simulated regular height toilet transfer and it was a little difficult, so practiced on BSC. Recommended pt sitting to bathe. Educated on toilet aid for hygiene.    OT Diagnosis: Acute pain  OT Problem List: Decreased activity tolerance;Decreased strength;Impaired balance (sitting and/or standing);Decreased knowledge of use of DME or AE;Decreased knowledge of precautions;Pain OT Treatment Interventions: Self-care/ADL training;DME and/or AE instruction;Therapeutic activities;Patient/family education;Balance training   OT Goals(Current goals can be found in the care plan section) Acute Rehab OT Goals Patient Stated Goal: go home OT Goal Formulation: With patient Time For Goal Achievement: 04/18/13 Potential to Achieve Goals: Good ADL Goals Pt Will Perform Grooming: with modified independence;standing Pt Will Perform Lower Body Bathing: sit to/from stand;with modified independence;with adaptive equipment Pt Will Perform Lower Body Dressing: with modified independence;with adaptive equipment;sit to/from stand Pt Will Transfer to Toilet: with modified independence;ambulating (3 in 1 over commode) Pt Will Perform Toileting - Clothing Manipulation and hygiene: with modified independence;sit to/from stand Pt Will Perform Tub/Shower Transfer: Shower transfer;ambulating;with supervision;rolling walker (shower equipment tbd) Additional ADL Goal #1: Pt will independently verbalize and demonstrate 3/3 back precautions.   Visit Information  Last OT Received On: 04/11/13 Assistance Needed: +1 History of Present Illness: 79 y.o. s/p LUMBAR THREE-FOUR,LUMBAR FOUR-FIVE POSTERIOR LUMBAR INTERBODY FUSION WITH INTERBODY PROSTHESIS POSTERIOR LATERAL ARTHRODESIS AND POSTERIOR SEGMENTAL INSTRUMENTATION (N/A)       Prior Functioning     Home Living Family/patient expects to be discharged to:: Private residence Living Arrangements: Spouse/significant other Available Help at Discharge:  Family;Available 24 hours/day Type of Home: House Home Access: Stairs to enter [employer] of Steps: 3 Entrance Stairs-Rails: None Home Layout: One level Home Equipment:  (daughter may be getting him quad cane) Prior Function Level of Independence: Independent Communication Communication: HOH Dominant Hand: Right         Vision/Perception  Cognition  Cognition Arousal/Alertness: Lethargic Behavior During Therapy: WFL for tasks assessed/performed Overall Cognitive Status: Within Functional Limits for tasks assessed    Extremity/Trunk Assessment Upper Extremity Assessment Upper Extremity Assessment: Overall WFL for tasks assessed Lower Extremity Assessment Lower Extremity Assessment: Defer to PT evaluation     Mobility Bed Mobility Bed Mobility: Rolling Right;Rolling Left;Right Sidelying to Sit Rolling Right: 4: Min guard Rolling Left: 5: Supervision Right Sidelying to Sit: 4: Min guard Details for Bed Mobility Assistance: Cues for technique. Transfers Transfers: Sit to Stand;Stand to Sit Sit to Stand: 4: Min guard;With upper extremity assist;From chair/3-in-1;From bed;From toilet Stand to Sit: 4: Min guard;With upper extremity assist;To chair/3-in-1;To toilet;To bed Details for Transfer Assistance: Cues for technique.     Exercise     Balance     End of Session OT - End of Session Equipment Utilized During Treatment: Gait belt;Rolling walker Activity Tolerance: Patient limited by lethargy Patient left: in bed;with call bell/phone within reach;with family/visitor present  GO     Earlie Raveling OTR/L [phone] 04/11/2013, 5:45 PM

## 2013-04-11 NOTE — Progress Notes (Signed)
Patient swelling in hand reduced and pain subsiding.  Will continue to monitor. Lance Bosch, RN

## 2013-04-12 MED ORDER — DIAZEPAM 5 MG PO TABS: 5.0000 mg | ORAL_TABLET | Freq: Four times a day (QID) | ORAL | Status: AC | PRN

## 2013-04-12 NOTE — Progress Notes (Signed)
Occupational Therapy Treatment Patient Details Name: Raymond Holloway MRN: [id number] DOB: 12-20-1933 Today's Date: 04/12/2013 Time: 4098-1191 OT Time Calculation (min): 34 min  OT Assessment / Plan / Recommendation  History of present illness 79 y.o. s/p LUMBAR THREE-FOUR,LUMBAR FOUR-FIVE POSTERIOR LUMBAR INTERBODY FUSION WITH INTERBODY PROSTHESIS POSTERIOR LATERAL ARTHRODESIS AND POSTERIOR SEGMENTAL INSTRUMENTATION (N/A)   OT comments  Pt moving well during session. Education provided to pt and wife. Feel pt is safe to d/c home, from OT standpoint, with wife available to assist 24/7.   Follow Up Recommendations  No OT follow up;Supervision/Assistance - 24 hour    Barriers to Discharge       Equipment Recommendations  3 in 1 bedside comode;Other (comment) (AE-long handled sponge, long shoehorn, sockaid, reacher)    Recommendations for Other Services    Frequency Min 2X/week   Progress towards OT Goals Progress towards OT goals: Progressing toward goals  Plan Discharge plan remains appropriate    Precautions / Restrictions Precautions Precautions: Back;Fall Precaution Comments: Reviewed precautions with pt   Pertinent Vitals/Pain No pain reported.     ADL  Upper Body Dressing: Set up;Supervision/safety Where Assessed - Upper Body Dressing: Unsupported sitting Lower Body Dressing: Minimal assistance Where Assessed - Lower Body Dressing: Supported sit to stand Toilet Transfer: Hydrographic surveyor Method: Sit to Barista: Regular height toilet;Grab bars Tub/Shower Transfer: Insurance risk surveyor Method: Science writer: Walk in shower;Shower seat with back Equipment Used: Gait belt;Rolling walker;Reacher;Long-handled shoe horn;Sock aid Transfers/Ambulation Related to ADLs: Min guard for ambulation and transfers. ADL Comments: Pt practiced with AE for LB ADLs. Cues for precautions during session. Pt practiced  simulated shower transfer as well as simulated regular height toilet transfer. Educated to have wife pick up rugs in house (non skid in bathroom).     OT Diagnosis:    OT Problem List:   OT Treatment Interventions:     OT Goals(current goals can now be found in the care plan section) Acute Rehab OT Goals Patient Stated Goal: go home OT Goal Formulation: With patient Time For Goal Achievement: 04/18/13 Potential to Achieve Goals: Good ADL Goals Pt Will Perform Grooming: with modified independence;standing Pt Will Perform Lower Body Bathing: sit to/from stand;with modified independence;with adaptive equipment Pt Will Perform Lower Body Dressing: with modified independence;with adaptive equipment;sit to/from stand Pt Will Transfer to Toilet: with modified independence;ambulating;regular height toilet Pt Will Perform Toileting - Clothing Manipulation and hygiene: with modified independence;sit to/from stand Pt Will Perform Tub/Shower Transfer: Shower transfer;ambulating;with supervision;rolling walker (shower equipment tbd) Additional ADL Goal #1: Pt will independently verbalize and demonstrate 3/3 back precautions.   Visit Information  Last OT Received On: 04/12/13 Assistance Needed: +1 History of Present Illness: 79 y.o. s/p LUMBAR THREE-FOUR,LUMBAR FOUR-FIVE POSTERIOR LUMBAR INTERBODY FUSION WITH INTERBODY PROSTHESIS POSTERIOR LATERAL ARTHRODESIS AND POSTERIOR SEGMENTAL INSTRUMENTATION (N/A)    Subjective Data      Prior Functioning       Cognition  Cognition Arousal/Alertness: Awake/alert Behavior During Therapy: WFL for tasks assessed/performed Overall Cognitive Status: Within Functional Limits for tasks assessed    Mobility  Bed Mobility Bed Mobility: Sit to Sidelying Right;Rolling Right;Rolling Left;Right Sidelying to Sit Rolling Right: 5: Supervision Rolling Left: 4: Min guard Right Sidelying to Sit: 4: Min guard Sit to Sidelying Right: 4: Min guard Details for Bed  Mobility Assistance: Cues for precautions. Transfers Transfers: Sit to Stand;Stand to Sit Sit to Stand: 5: Supervision;4: Min guard;With upper extremity assist;From chair/3-in-1;From bed;From toilet Stand  to Sit: 5: Supervision;4: Min guard;With upper extremity assist;To bed;To chair/3-in-1;To toilet Details for Transfer Assistance: Min guard for sit <> stand from simulated regular height commode. Cues for hand placement.    Exercises      Balance     End of Session OT - End of Session Equipment Utilized During Treatment: Gait belt;Rolling walker Activity Tolerance: Patient tolerated treatment well Patient left: in chair;with call bell/phone within reach;with family/visitor present  GO     Earlie Raveling OTR/L [phone] 04/12/2013, 5:08 PM

## 2013-04-12 NOTE — Evaluation (Signed)
Physical Therapy Evaluation Patient Details Name: Raymond Holloway MRN: [id number] DOB: 1933-09-27 Today's Date: 04/12/2013 Time: 4098-1191 PT Time Calculation (min): 31 min  PT Assessment / Plan / Recommendation History of Present Illness  79 y.o. s/p LUMBAR THREE-FOUR,LUMBAR FOUR-FIVE POSTERIOR LUMBAR INTERBODY FUSION WITH INTERBODY PROSTHESIS POSTERIOR LATERAL ARTHRODESIS AND POSTERIOR SEGMENTAL INSTRUMENTATION (N/A)  Clinical Impression   Pt presents with improved mentation and mobility this visit, continues to need PT for functional mobility deficits and is ready for d/c home with spouse.  D/c pending today, will sign off care.    PT Assessment  All further PT needs can be met in the next venue of care    Follow Up Recommendations  Home health PT;Supervision - Intermittent    Does the patient have the potential to tolerate intense rehabilitation      Barriers to Discharge        Equipment Recommendations  Rolling walker with 5" wheels    Recommendations for Other Services     Frequency      Precautions / Restrictions Precautions Precautions: Back;Fall Precaution Comments: Reviewed precautions with pt and wife   Pertinent Vitals/Pain 'numb'; educated patient on risks of over/under doing activity; verbalizes      Mobility  Bed Mobility Bed Mobility: Not assessed Transfers Transfers: Sit to Stand;Stand to Sit Sit to Stand: 5: Supervision;With upper extremity assist;From chair/3-in-1 Stand to Sit: 5: Supervision;With armrests;With upper extremity assist;To chair/3-in-1 Details for Transfer Assistance: Cues for technique. Ambulation/Gait Ambulation/Gait Assistance: 5: Supervision Ambulation Distance (Feet): 200 Feet Assistive device: Rolling walker Ambulation/Gait Assistance Details: cues for posture and proximity to device Gait Pattern: Step-through pattern;Trunk flexed Stairs: Yes Stairs Assistance: 5: Supervision Stairs Assistance Details (indicate cue type  and reason): close supervision for safety with cues for weak/strong limb lead Stair Management Technique: One rail Left;Forwards Number of Stairs: 3 Wheelchair Mobility Wheelchair Mobility: No    Exercises     PT Diagnosis: Difficulty walking  PT Problem List: Decreased strength;Pain;Decreased knowledge of use of DME;Decreased mobility PT Treatment Interventions:       PT Goals(Current goals can be found in the care plan section) Acute Rehab PT Goals Patient Stated Goal: go home PT Goal Formulation: No goals set, d/c therapy  Visit Information  Last PT Received On: 04/12/13 Assistance Needed: +1 History of Present Illness: 79 y.o. s/p LUMBAR THREE-FOUR,LUMBAR FOUR-FIVE POSTERIOR LUMBAR INTERBODY FUSION WITH INTERBODY PROSTHESIS POSTERIOR LATERAL ARTHRODESIS AND POSTERIOR SEGMENTAL INSTRUMENTATION (N/A)       Prior Functioning  Home Living Family/patient expects to be discharged to:: Private residence Living Arrangements: Spouse/significant other Available Help at Discharge: Family;Available 24 hours/day Type of Home: House Home Access: Stairs to enter [employer] of Steps: 3 Entrance Stairs-Rails: None Home Layout: One level Home Equipment: None Prior Function Level of Independence: Independent Communication Communication: HOH Dominant Hand: Right    Cognition  Cognition Arousal/Alertness: Awake/alert Behavior During Therapy: WFL for tasks assessed/performed Overall Cognitive Status: Within Functional Limits for tasks assessed    Extremity/Trunk Assessment Upper Extremity Assessment Upper Extremity Assessment: Defer to OT evaluation Lower Extremity Assessment Lower Extremity Assessment: Generalized weakness;Overall [facility] for tasks assessed Cervical / Trunk Assessment Cervical / Trunk Assessment: Kyphotic   Balance    End of Session PT - End of Session Activity Tolerance: Patient tolerated treatment well Patient left: in chair;with family/visitor  present Nurse Communication: Mobility status  GP     Dennis Bast 04/12/2013, 12:27 PM

## 2013-04-12 NOTE — Progress Notes (Addendum)
   CARE MANAGEMENT NOTE 04/12/2013  Patient:  Raymond Holloway   Account Number:  [id number]  Date Initiated:  04/09/2013  Documentation initiated by:  Jiles Crocker  Subjective/Objective Assessment:   ADMITTED FOR BACK SURGERY     Action/Plan:   CM FOLLOWING FOR DCP   Anticipated DC Date:  04/14/2013   Anticipated DC Plan:  HOME W HOME HEALTH SERVICES      DC Planning Services  CM consult      [facility] Choice  HOME HEALTH   Choice offered to / List presented to:  C-1 Patient        HH arranged  HH-2 PT  HH-3 OT      [facility] agency  [facility]   Status of service:  In process, will continue to follow Medicare Important Message given?  NA - LOS <3 / Initial given by admissions (If response is "NO", the following Medicare IM given date fields will be blank) Date Medicare IM given:   Date Additional Medicare IM given:    Discharge Disposition:  HOME W HOME HEALTH SERVICES  Per UR Regulation:  Reviewed for med. necessity/level of care/duration of stay  If discussed at Long Length of Stay Meetings, dates discussed:    Comments:  04/12/13 14:08 RN called to state spouse of pt states they DO NOT have a walker and need one as recc by PT.  Order was placed and DME to be delivered to room prior to discharge.  Raymond Holloway, BSN, [place] [phone].  04/12/13 14:00 Cm spoke with pt for choice.  Pt chooses [facility] for HHPT/OT.  Pt states he has a walker at home and does not need any other DME.  Address and contact numbers were verified.  Referral faxed to [facility] for HHPT/OT.  No other needs were communicated.  Raymond Holloway, BSN, CM [phone].  12/4/2014Abelino Derrick RN,BSN,MHA [phone]

## 2013-04-12 NOTE — Progress Notes (Signed)
Subjective: Patient reports feeling better  Objective: Vital signs in last 24 hours: Temp:  [97.3 F (36.3 C)-98.6 F (37 C)] 97.6 F (36.4 C) (12/07 0938) Pulse Rate:  [65-92] 85 (12/07 0938) Resp:  [16-20] 18 (12/07 0938) BP: (126-178)/(43-88) 136/43 mmHg (12/07 0938) SpO2:  [92 %-97 %] 92 % (12/07 0938)  Intake/Output from previous day: 12/06 0701 - 12/07 0700 In: 480 [P.O.:480] Out: 1400 [Urine:1400] Intake/Output this shift: Total I/O In: 240 [P.O.:240] Out: -   Physical Exam: Strength good.  Dressing CDI  Lab Results: No results found for this basename: WBC, HGB, HCT, PLT,  in the last 72 hours BMET No results found for this basename: NA, K, CL, CO2, GLUCOSE, BUN, CREATININE, CALCIUM,  in the last 72 hours  Studies/Results: No results found.  Assessment/Plan: D/C home.  F/U in office with Dr. Franky Macho in 2-3 weeks.    LOS: 4 days    Dorian Heckle, MD 04/12/2013, 12:03 PM

## 2013-04-12 NOTE — Discharge Summary (Signed)
Physician Discharge Summary  Patient ID: Raymond Holloway MRN: [id number] DOB/AGE: July 22, 1933 79 y.o.  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  Discharge Diagnoses: Lumbar spondylosis and stenosis with synovial cysts and radiculopathy L 34 and L 45 levels.  Active Problems:   Spondylosis of lumbar region without myelopathy or radiculopathy   Discharged Condition: good  Hospital Course: Patient underwent decompression and fusion L 3 - L 5 levels.  Consults: None  Significant Diagnostic Studies: None  Treatments: surgery:decompression and fusion L 3 - L 5 levels.  Discharge Exam: Blood pressure 136/43, pulse 85, temperature 97.6 F (36.4 C), temperature source Oral, resp. rate 18, height 5\' 11"  (1.803 m), weight 86.183 kg (190 lb), SpO2 92.00%. Neurologic: Alert and oriented X 3, normal strength and tone. Normal symmetric reflexes. Normal coordination and gait Wound:CDI  Disposition: Home     Medication List    STOP taking these medications       HYDROcodone-acetaminophen 5-325 MG per tablet  Commonly known as:  NORCO/VICODIN      TAKE these medications       alendronate 70 MG tablet  Commonly known as:  FOSAMAX  Take 70 mg by mouth once a week. Take with a full glass of water on an empty stomach.     aspirin EC 81 MG tablet  Take 81 mg by mouth daily.     CALCIUM PO  Take 1,250 mg by mouth daily.     cyclobenzaprine 10 MG tablet  Commonly known as:  FLEXERIL  Take 1 tablet (10 mg total) by mouth 3 (three) times daily as needed for muscle spasms.     diazepam 5 MG tablet  Commonly known as:  VALIUM  Take 1 tablet (5 mg total) by mouth every 6 (six) hours as needed for muscle spasms.     methylphenidate 10 MG tablet  Commonly known as:  RITALIN  Take 10 mg by mouth daily.     metoprolol tartrate 25 MG tablet  Commonly known as:   LOPRESSOR  Take 25 mg by mouth 2 (two) times daily.     oxyCODONE-acetaminophen 5-325 MG per tablet  Commonly known as:  PERCOCET/ROXICET  Take 1-2 tablets by mouth every 6 (six) hours as needed for moderate pain.     pravastatin 20 MG tablet  Commonly known as:  PRAVACHOL  Take 20 mg by mouth daily.     traZODone 50 MG tablet  Commonly known as:  DESYREL  Take 50 mg by mouth at bedtime.     zolpidem 10 MG tablet  Commonly known as:  AMBIEN  Take 5 mg by mouth at bedtime as needed for sleep.           Follow-up Information   Follow up with CABBELL,KYLE L, MD In 10 weeks. (call to make appointment to remove sutures)    Specialty:  Neurosurgery   Contact information:   [address]                         [address] [place] [address] [phone]       Signed: Dorian Heckle, MD 04/12/2013, 12:17 PM

## 2015-05-03 IMAGING — DX DG LUMBAR SPINE 2-3V
1 series · 1 of 1 positions shown · non-contrast
Comparison: 03/03/2013

CLINICAL DATA: L3-4, L4-5 PLIF

EXAM:
LUMBAR SPINE - 2-3 VIEW

[lat]
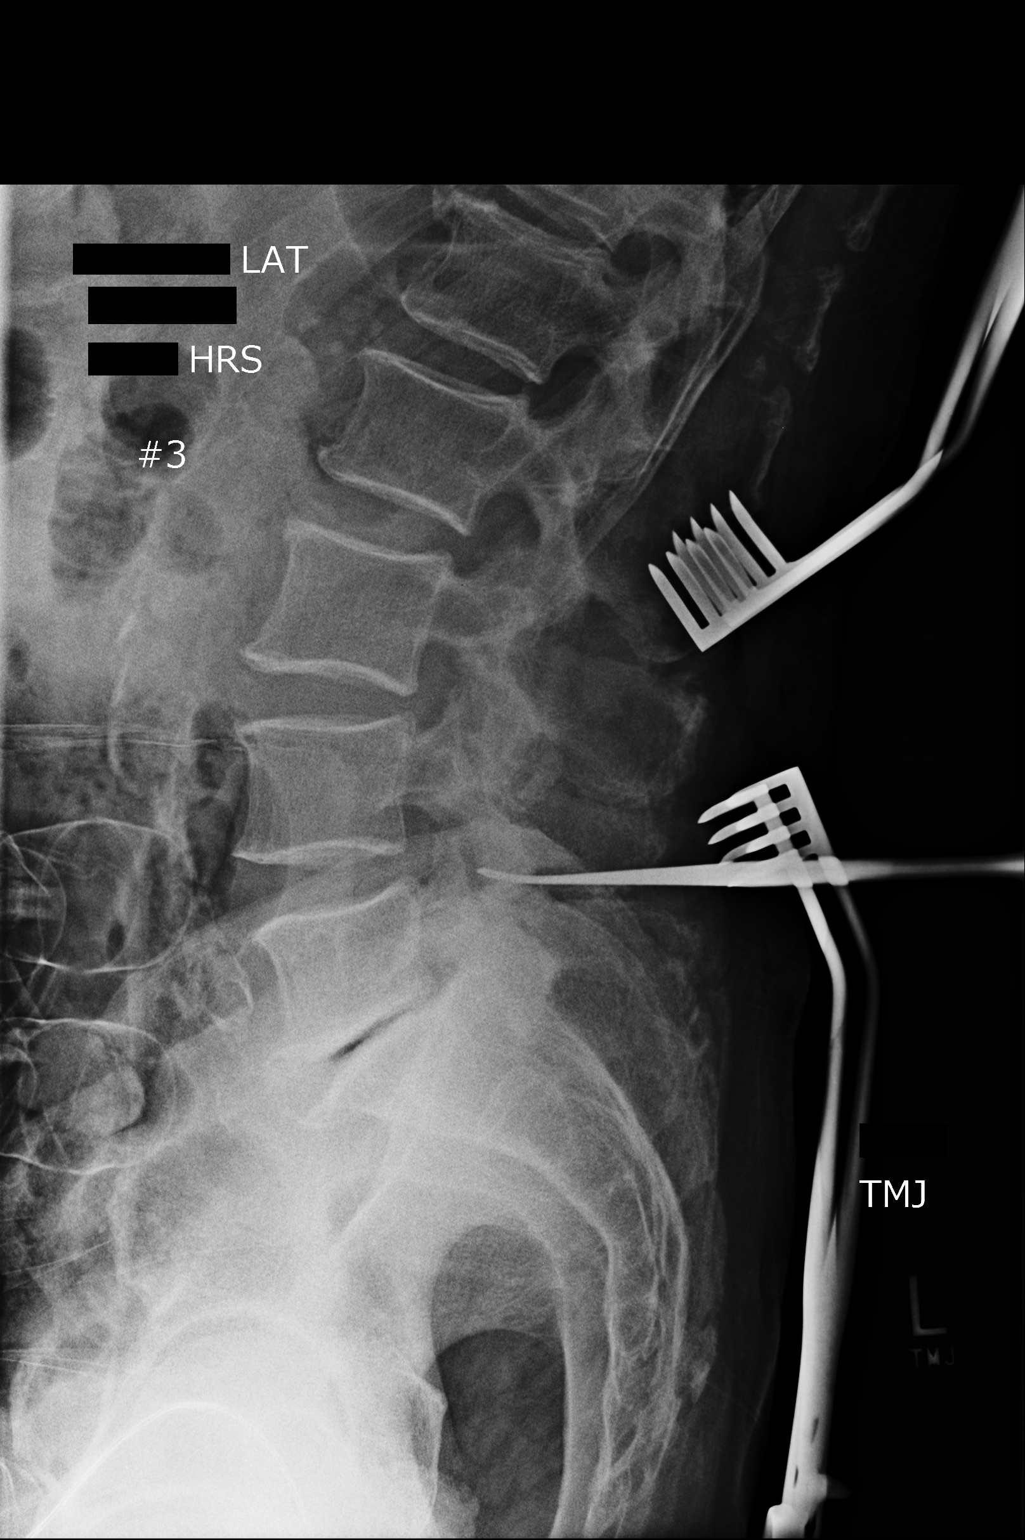

[1 of 1 positions shown; findings below may reference images not displayed]

FINDINGS: The lowest disc space is L5-S1 which is significantly degenerated.
Grade 1 anterior slip L4-5.

Image number 1 reveals a needle overlying the upper aspect of the L5
spinous process.

Image number 2 reveals an instrument under the L2 lamina.

Image number 3 reveals an instrument under the L4 lamina.
IMPRESSION: Lumbar localization as above.

## 2019-06-03 ENCOUNTER — Ambulatory Visit: Payer: Self-pay

## 2019-06-11 ENCOUNTER — Ambulatory Visit: Payer: Medicare Other | Attending: Internal Medicine

## 2019-06-11 DIAGNOSIS — Z23 Encounter for immunization: Secondary | ICD-10-CM

## 2019-06-11 NOTE — Progress Notes (Signed)
   Covid-19 Vaccination Clinic  Name:  Raymond Holloway    MRN: [id number] DOB: May 20, 1933  06/11/2019  [name] was observed post Covid-19 immunization for 15 minutes without incidence. He was provided with Vaccine Information Sheet and instruction to access the V-Safe system.   [name] was instructed to call 911 with any severe reactions post vaccine: Marland Kitchen Difficulty breathing  . Swelling of your face and throat  . A fast heartbeat  . A bad rash all over your body  . Dizziness and weakness    Immunizations Administered    Name Date Dose VIS Date Route   Pfizer COVID-19 Vaccine 06/11/2019  2:16 PM 0.3 mL 04/17/2019 Intramuscular   Manufacturer: [place]   Lot: [id number]   [place]: [id number]

## 2019-07-06 ENCOUNTER — Ambulatory Visit: Payer: Medicare Other | Attending: Internal Medicine

## 2019-07-06 DIAGNOSIS — Z23 Encounter for immunization: Secondary | ICD-10-CM | POA: Insufficient documentation

## 2019-07-06 NOTE — Progress Notes (Signed)
   Covid-19 Vaccination Clinic  Name:  Raymond Holloway    MRN: [id number] DOB: [birth year]  07/06/2019  Raymond Holloway was observed post Covid-19 immunization for 15 minutes without incidence. He was provided with Vaccine Information Sheet and instruction to access the V-Safe system.   Raymond Holloway was instructed to call 911 with any severe reactions post vaccine: Marland Kitchen Difficulty breathing  . Swelling of your face and throat  . A fast heartbeat  . A bad rash all over your body  . Dizziness and weakness    Immunizations Administered    Name Date Dose VIS Date Route   Pfizer COVID-19 Vaccine 07/06/2019  4:47 PM 0.3 mL 04/17/2019 Intramuscular   Manufacturer: [place]   Lot: [id number]   [place]: [id number]
